# Patient Record
Sex: Female | Born: 1985 | Race: White | Hispanic: No | Marital: Married | Smoking: Never smoker
Health system: Southern US, Community
[De-identification: ages and names within clinical notes are randomized; demographics above are authoritative.]

## PROBLEM LIST (undated history)

## (undated) ENCOUNTER — Inpatient Hospital Stay (HOSPITAL_COMMUNITY): Payer: Self-pay

## (undated) DIAGNOSIS — Z789 Other specified health status: Secondary | ICD-10-CM

## (undated) HISTORY — PX: FINGER SURGERY: SHX640

---

## 2016-04-10 ENCOUNTER — Encounter (HOSPITAL_COMMUNITY): Payer: Self-pay | Admitting: *Deleted

## 2016-04-10 ENCOUNTER — Inpatient Hospital Stay (HOSPITAL_COMMUNITY)
Admission: AD | Admit: 2016-04-10 | Discharge: 2016-04-11 | Disposition: A | Discharge status: Home / Self Care | Payer: BC Managed Care – PPO | Source: Ambulatory Visit | Attending: Obstetrics and Gynecology | Admitting: Obstetrics and Gynecology

## 2016-04-10 ENCOUNTER — Inpatient Hospital Stay (HOSPITAL_COMMUNITY): Payer: BC Managed Care – PPO

## 2016-04-10 DIAGNOSIS — R1032 Left lower quadrant pain: Secondary | ICD-10-CM | POA: Insufficient documentation

## 2016-04-10 DIAGNOSIS — O26891 Other specified pregnancy related conditions, first trimester: Secondary | ICD-10-CM | POA: Diagnosis present

## 2016-04-10 DIAGNOSIS — Z3A01 Less than 8 weeks gestation of pregnancy: Secondary | ICD-10-CM | POA: Insufficient documentation

## 2016-04-10 DIAGNOSIS — O26899 Other specified pregnancy related conditions, unspecified trimester: Secondary | ICD-10-CM

## 2016-04-10 DIAGNOSIS — R109 Unspecified abdominal pain: Secondary | ICD-10-CM

## 2016-04-10 HISTORY — DX: Other specified health status: Z78.9

## 2016-04-10 NOTE — MAU Provider Note (Addendum)
History     Chief Complaint  Patient presents with  . Abdominal Pain   29 yo G2P1001 MF 6 6/[redacted] weeks gestation presents with c/o LLQ pain since 12 n. Pain is dull constant nonradiating, no assoc n/v/urinary sx or BM. (+) SPT in office with [facility].100K  OB History    Gravida Para Term Preterm AB TAB SAB Ectopic Multiple Living   2 1 1  0 0 0 0 0 0 1      Past Medical History  Diagnosis Date  . Medical history non-contributory    OB: NSVD Past Surgical History  Procedure Laterality Date  . Finger surgery      FH noncontributory  Social History  Substance Use Topics  . Smoking status: Never Smoker   . Smokeless tobacco: None  . Alcohol Use: No    Allergies: Not on File  No prescriptions prior to admission     Physical Exam   Blood pressure 111/60, pulse 77, temperature 98.2 F (36.8 C), temperature source Oral, resp. rate 18, height 5\' 6"  (1.676 m), weight 65.772 kg (145 lb), SpO2 98 %.  General appearance: alert, cooperative and no distress Abdomen: soft, non-tender; bowel sounds normal; no masses,  no organomegaly Pelvic: cervix normal in appearance, external genitalia normal and vagina.scant white discharge, cervix closed ant , uterus RV 8 weeks  size, right adnexa nontender, left mildly tender no discete mass  Extr: no edema  ED Course  LLQ pain (+) SPT P) TVS MDM   Jessica Kaiser A, MD 11:00 PM 04/10/2016     Addendum: sono done. No etiology for LLQ pain. SIUP (+) [facility] EDC 11/29/16 c/w LMP. Nl ovaries. Advised may take tylenol, use heat. cxl office appt 5/17. Schedule NOB appts

## 2016-04-10 NOTE — MAU Note (Signed)
Pt c/o dull, achy LLQ pain since today. Rates 6/10. Was told to come in by on call doctor. Denies vag bleeding or discharge. Did not take anything but did try to rest.

## 2016-04-10 NOTE — Discharge Instructions (Signed)
First Trimester of Pregnancy The first trimester of pregnancy is from week 1 until the end of week 12 (months 1 through 3). A week after a sperm fertilizes an egg, the egg will implant on the wall of the uterus. This embryo will begin to develop into a baby. Genes from you and your partner are forming the baby. The female genes determine whether the baby is a boy or a girl. At 6-8 weeks, the eyes and face are formed, and the heartbeat can be seen on ultrasound. At the end of 12 weeks, all the baby's organs are formed.  Now that you are pregnant, you will want to do everything you can to have a healthy baby. Two of the most important things are to get good prenatal care and to follow your health care provider's instructions. Prenatal care is all the medical care you receive before the baby's birth. This care will help prevent, find, and treat any problems during the pregnancy and childbirth. BODY CHANGES Your body goes through many changes during pregnancy. The changes vary from woman to woman.   You may gain or lose a couple of pounds at first.  You may feel sick to your stomach (nauseous) and throw up (vomit). If the vomiting is uncontrollable, call your health care provider.  You may tire easily.  You may develop headaches that can be relieved by medicines approved by your health care provider.  You may urinate more often. Painful urination may mean you have a bladder infection.  You may develop heartburn as a result of your pregnancy.  You may develop constipation because certain hormones are causing the muscles that push waste through your intestines to slow down.  You may develop hemorrhoids or swollen, bulging veins (varicose veins).  Your breasts may begin to grow larger and become tender. Your nipples may stick out more, and the tissue that surrounds them (areola) may become darker.  Your gums may bleed and may be sensitive to brushing and flossing.  Dark spots or blotches (chloasma,  mask of pregnancy) may develop on your face. This will likely fade after the baby is born.  Your menstrual periods will stop.  You may have a loss of appetite.  You may develop cravings for certain kinds of food.  You may have changes in your emotions from day to day, such as being excited to be pregnant or being concerned that something may go wrong with the pregnancy and baby.  You may have more vivid and strange dreams.  You may have changes in your hair. These can include thickening of your hair, rapid growth, and changes in texture. Some women also have hair loss during or after pregnancy, or hair that feels dry or thin. Your hair will most likely return to normal after your baby is born. WHAT TO EXPECT AT YOUR PRENATAL VISITS During a routine prenatal visit:  You will be weighed to make sure you and the baby are growing normally.  Your blood pressure will be taken.  Your abdomen will be measured to track your baby's growth.  The fetal heartbeat will be listened to starting around week 10 or 12 of your pregnancy.  Test results from any previous visits will be discussed. Your health care provider may ask you:  How you are feeling.  If you are feeling the baby move.  If you have had any abnormal symptoms, such as leaking fluid, bleeding, severe headaches, or abdominal cramping.  If you are using any tobacco products,   including cigarettes, chewing tobacco, and electronic cigarettes.  If you have any questions. Other tests that may be performed during your first trimester include:  Blood tests to find your blood type and to check for the presence of any previous infections. They will also be used to check for low iron levels (anemia) and Rh antibodies. Later in the pregnancy, blood tests for diabetes will be done along with other tests if problems develop.  Urine tests to check for infections, diabetes, or protein in the urine.  An ultrasound to confirm the proper growth  and development of the baby.  An amniocentesis to check for possible genetic problems.  Fetal screens for spina bifida and Down syndrome.  You may need other tests to make sure you and the baby are doing well.  HIV (human immunodeficiency virus) testing. Routine prenatal testing includes screening for HIV, unless you choose not to have this test. HOME CARE INSTRUCTIONS  Medicines  Follow your health care provider's instructions regarding medicine use. Specific medicines may be either safe or unsafe to take during pregnancy.  Take your prenatal vitamins as directed.  If you develop constipation, try taking a stool softener if your health care provider approves. Diet  Eat regular, well-balanced meals. Choose a variety of foods, such as meat or vegetable-based protein, fish, milk and low-fat dairy products, vegetables, fruits, and whole grain breads and cereals. Your health care provider will help you determine the amount of weight gain that is right for you.  Avoid raw meat and uncooked cheese. These carry germs that can cause birth defects in the baby.  Eating four or five small meals rather than three large meals a day may help relieve nausea and vomiting. If you start to feel nauseous, eating a few soda crackers can be helpful. Drinking liquids between meals instead of during meals also seems to help nausea and vomiting.  If you develop constipation, eat more high-fiber foods, such as fresh vegetables or fruit and whole grains. Drink enough fluids to keep your urine clear or pale yellow. Activity and Exercise  Exercise only as directed by your health care provider. Exercising will help you:  Control your weight.  Stay in shape.  Be prepared for labor and delivery.  Experiencing pain or cramping in the lower abdomen or low back is a good sign that you should stop exercising. Check with your health care provider before continuing normal exercises.  Try to avoid standing for long  periods of time. Move your legs often if you must stand in one place for a long time.  Avoid heavy lifting.  Wear low-heeled shoes, and practice good posture.  You may continue to have sex unless your health care provider directs you otherwise. Relief of Pain or Discomfort  Wear a good support bra for breast tenderness.   Take warm sitz baths to soothe any pain or discomfort caused by hemorrhoids. Use hemorrhoid cream if your health care provider approves.   Rest with your legs elevated if you have leg cramps or low back pain.  If you develop varicose veins in your legs, wear support hose. Elevate your feet for 15 minutes, 3-4 times a day. Limit salt in your diet. Prenatal Care  Schedule your prenatal visits by the twelfth week of pregnancy. They are usually scheduled monthly at first, then more often in the last 2 months before delivery.  Write down your questions. Take them to your prenatal visits.  Keep all your prenatal visits as directed by your   health care provider. Safety  Wear your seat belt at all times when driving.  Make a list of emergency phone numbers, including numbers for family, friends, the hospital, and police and fire departments. General Tips  Ask your health care provider for a referral to a local prenatal education class. Begin classes no later than at the beginning of month 6 of your pregnancy.  Ask for help if you have counseling or nutritional needs during pregnancy. Your health care provider can offer advice or refer you to specialists for help with various needs.  Do not use hot tubs, steam rooms, or saunas.  Do not douche or use tampons or scented sanitary pads.  Do not cross your legs for long periods of time.  Avoid cat litter boxes and soil used by cats. These carry germs that can cause birth defects in the baby and possibly loss of the fetus by miscarriage or stillbirth.  Avoid all smoking, herbs, alcohol, and medicines not prescribed by  your health care provider. Chemicals in these affect the formation and growth of the baby.  Do not use any tobacco products, including cigarettes, chewing tobacco, and electronic cigarettes. If you need help quitting, ask your health care provider. You may receive counseling support and other resources to help you quit.  Schedule a dentist appointment. At home, brush your teeth with a soft toothbrush and be gentle when you floss. SEEK MEDICAL CARE IF:   You have dizziness.  You have mild pelvic cramps, pelvic pressure, or nagging pain in the abdominal area.  You have persistent nausea, vomiting, or diarrhea.  You have a bad smelling vaginal discharge.  You have pain with urination.  You notice increased swelling in your face, hands, legs, or ankles. SEEK IMMEDIATE MEDICAL CARE IF:   You have a fever.  You are leaking fluid from your vagina.  You have spotting or bleeding from your vagina.  You have severe abdominal cramping or pain.  You have rapid weight gain or loss.  You vomit blood or material that looks like coffee grounds.  You are exposed to German measles and have never had them.  You are exposed to fifth disease or chickenpox.  You develop a severe headache.  You have shortness of breath.  You have any kind of trauma, such as from a fall or a car accident.   This information is not intended to replace advice given to you by your health care provider. Make sure you discuss any questions you have with your health care provider.   Document Released: 11/08/2001 Document Revised: 12/05/2014 Document Reviewed: 09/24/2013 Elsevier Interactive Patient Education 2016 [employer]  

## 2016-04-27 ENCOUNTER — Inpatient Hospital Stay (HOSPITAL_COMMUNITY)
Admission: AD | Admit: 2016-04-27 | Discharge: 2016-04-28 | Disposition: A | Discharge status: Home / Self Care | Payer: BC Managed Care – PPO | Source: Ambulatory Visit | Attending: Obstetrics and Gynecology | Admitting: Obstetrics and Gynecology

## 2016-04-27 ENCOUNTER — Encounter (HOSPITAL_COMMUNITY): Payer: Self-pay | Admitting: *Deleted

## 2016-04-27 DIAGNOSIS — Z3A09 9 weeks gestation of pregnancy: Secondary | ICD-10-CM | POA: Diagnosis not present

## 2016-04-27 DIAGNOSIS — O219 Vomiting of pregnancy, unspecified: Secondary | ICD-10-CM | POA: Insufficient documentation

## 2016-04-27 DIAGNOSIS — R1110 Vomiting, unspecified: Secondary | ICD-10-CM | POA: Diagnosis present

## 2016-04-27 LAB — URINE MICROSCOPIC-ADD ON

## 2016-04-27 LAB — URINALYSIS, ROUTINE W REFLEX MICROSCOPIC
BILIRUBIN URINE: NEGATIVE
Glucose, UA: NEGATIVE mg/dL
KETONES UR: 40 mg/dL — AB
LEUKOCYTES UA: NEGATIVE
NITRITE: NEGATIVE
PH: 6 (ref 5.0–8.0)
PROTEIN: 30 mg/dL — AB
Specific Gravity, Urine: >1.03 — ABNORMAL HIGH (ref 1.005–1.030)

## 2016-04-27 MED ORDER — PROMETHAZINE HCL 25 MG/ML IJ SOLN
25.0000 mg | Freq: Once | INTRAVENOUS | Status: AC
  Administered 2016-04-27: 25 mg via INTRAVENOUS
  Filled 2016-04-27: qty 1

## 2016-04-27 MED ORDER — LACTATED RINGERS IV BOLUS (SEPSIS)
1000.0000 mL | Freq: Once | INTRAVENOUS | Status: AC
  Administered 2016-04-27: 1000 mL via INTRAVENOUS

## 2016-04-27 NOTE — MAU Provider Note (Signed)
History     CSN: [id number]  Arrival date and time: 04/27/16 2124   First Provider Initiated Contact with Patient 04/27/16 2218      Chief Complaint  Patient presents with  . Emesis   HPI Comments: Jessica Kaiser is a 30 y.o G2P1001 at 966w2d who presents otday with nausea and vomiting. She states that it started today and she has vomited about 8x. She called her Dr. Isidore Moosffice and a prescription for phenergan suppositories was called. She states that they did not help much.   Emesis  This is a new problem. The current episode started today. The problem occurs 5 to 10 times per day. The problem has been gradually worsening. The emesis has an appearance of stomach contents. There has been no fever. Pertinent negatives include no chills, diarrhea or fever. Risk factors: pregnancy  Treatments tried: phenergan suppository  The treatment provided mild relief.   Past Medical History  Diagnosis Date  . Medical history non-contributory     Past Surgical History  Procedure Laterality Date  . Finger surgery      History reviewed. No pertinent family history.  Social History  Substance Use Topics  . Smoking status: Never Smoker   . Smokeless tobacco: Never Used  . Alcohol Use: No    Allergies: No Known Allergies  Prescriptions prior to admission  Medication Sig Dispense Refill Last Dose  . Prenatal Vit-Fe Fumarate-FA (PRENATAL MULTIVITAMIN) TABS tablet Take 1 tablet by mouth daily at 12 noon.   04/26/2016 at Unknown time  . PROMETHEGAN 25 MG suppository Place 25 mg rectally every 4 (four) hours as needed for nausea or vomiting.    04/27/2016 at Unknown time    Review of Systems  Constitutional: Negative for fever and chills.  Gastrointestinal: Positive for nausea and vomiting. Negative for diarrhea and constipation.  Genitourinary: Negative for dysuria, urgency and frequency.   Physical Exam   Blood pressure 99/63, pulse 76, temperature 98.3 F (36.8 C), temperature source Oral,  resp. rate 18, height 5\' 6"  (1.676 m), weight 64.864 kg (143 lb).  Physical Exam  Nursing note and vitals reviewed. Constitutional: She is oriented to person, place, and time. She appears well-developed and well-nourished. No distress.  HENT:  Head: Normocephalic.  Cardiovascular: Normal rate.   Respiratory: Effort normal.  GI: Soft. There is no tenderness. There is no rebound.  Neurological: She is alert and oriented to person, place, and time.  Skin: Skin is warm and dry.  Psychiatric: She has a normal mood and affect.    Results for orders placed or performed during the hospital encounter of 04/27/16 (from the past 24 hour(s))  Urinalysis, Routine w reflex microscopic (not at [facility])     Status: Abnormal   Collection Time: 04/27/16  9:28 PM  Result Value Ref Range   Color, Urine YELLOW YELLOW   APPearance CLEAR CLEAR   Specific Gravity, Urine >1.030 (H) 1.005 - 1.030   pH 6.0 5.0 - 8.0   Glucose, UA NEGATIVE NEGATIVE mg/dL   Hgb urine dipstick TRACE (A) NEGATIVE   Bilirubin Urine NEGATIVE NEGATIVE   Ketones, ur 40 (A) NEGATIVE mg/dL   Protein, ur 30 (A) NEGATIVE mg/dL   Nitrite NEGATIVE NEGATIVE   Leukocytes, UA NEGATIVE NEGATIVE  Urine microscopic-add on     Status: Abnormal   Collection Time: 04/27/16  9:28 PM  Result Value Ref Range   Squamous Epithelial / LPF 0-5 (A) NONE SEEN   WBC, UA 0-5 0 - 5 WBC/hpf  RBC / HPF 0-5 0 - 5 RBC/hpf   Bacteria, UA FEW (A) NONE SEEN   Urine-Other MUCOUS PRESENT     MAU Course  Procedures  MDM 0045: Patient has had D5LR with phenergan and second LR bolus. She reports feeling better and is tolerating PO. 0048: D/W Dr. Billy Coast, ok for DC home with RX for phenergan tablets.    Assessment and Plan   1. Nausea and vomiting during pregnancy prior to [redacted] weeks gestation   2. [redacted] weeks gestation of pregnancy    DC home Comfort measures reviewed  1st Trimester precautions  RX: phenergan #30  Return to MAU as needed FU with OB as  planned  Follow-up Information    Follow up with Robley Fries, MD.   Specialty:  Obstetrics and Gynecology   Why:  As scheduled   Contact information:   [address] [place] [address] [phone]       Tawnya Crook 04/27/2016, 10:20 PM

## 2016-04-27 NOTE — MAU Note (Signed)
Pt presents complaining of nausea and vomiting x24hrs. Hasn't kept any fluid or foods down. Denies pain. Denies vaginal bleeding or discharge.

## 2016-04-28 DIAGNOSIS — O219 Vomiting of pregnancy, unspecified: Secondary | ICD-10-CM | POA: Diagnosis not present

## 2016-04-28 DIAGNOSIS — Z3A09 9 weeks gestation of pregnancy: Secondary | ICD-10-CM

## 2016-04-28 MED ORDER — PROMETHAZINE HCL 25 MG PO TABS: 12.5000 mg | ORAL_TABLET | Freq: Four times a day (QID) | ORAL | Status: AC | PRN

## 2016-04-28 NOTE — Discharge Instructions (Signed)

## 2016-11-27 ENCOUNTER — Other Ambulatory Visit
Admission: RE | Admit: 2016-11-27 | Discharge: 2016-11-27 | Disposition: A | Discharge status: Home / Self Care | Payer: BC Managed Care – PPO | Source: Ambulatory Visit | Attending: Certified Nurse Midwife | Admitting: Certified Nurse Midwife

## 2016-11-27 DIAGNOSIS — Z3A39 39 weeks gestation of pregnancy: Secondary | ICD-10-CM | POA: Insufficient documentation

## 2016-11-27 LAB — CBC
HCT: 43.3 % (ref 35.0–47.0)
HEMOGLOBIN: 14.7 g/dL (ref 12.0–16.0)
MCH: 34.8 pg — ABNORMAL HIGH (ref 26.0–34.0)
MCHC: 34 g/dL (ref 32.0–36.0)
MCV: 102.5 fL — AB (ref 80.0–100.0)
PLATELETS: 220 10*3/uL (ref 150–440)
RBC: 4.22 MIL/uL (ref 3.80–5.20)
RDW: 16.6 % — ABNORMAL HIGH (ref 11.5–14.5)
WBC: 8 10*3/uL (ref 3.6–11.0)

## 2016-11-27 LAB — ANTIBODY SCREEN: Antibody Screen: NEGATIVE

## 2016-11-27 LAB — ABO/RH: ABO/RH(D): A POS

## 2017-02-13 ENCOUNTER — Encounter (HOSPITAL_COMMUNITY): Payer: Self-pay

## 2020-03-17 ENCOUNTER — Other Ambulatory Visit: Payer: Self-pay

## 2020-03-17 DIAGNOSIS — N6310 Unspecified lump in the right breast, unspecified quadrant: Secondary | ICD-10-CM

## 2020-03-20 ENCOUNTER — Other Ambulatory Visit: Payer: Self-pay

## 2020-03-20 ENCOUNTER — Ambulatory Visit
Admission: RE | Admit: 2020-03-20 | Discharge: 2020-03-20 | Disposition: A | Discharge status: Home / Self Care | Payer: BC Managed Care – PPO | Source: Ambulatory Visit

## 2020-03-20 ENCOUNTER — Ambulatory Visit
Admission: RE | Admit: 2020-03-20 | Discharge: 2020-03-20 | Disposition: A | Discharge status: Home / Self Care | Payer: Commercial Managed Care - PPO | Source: Ambulatory Visit

## 2020-03-20 DIAGNOSIS — N6310 Unspecified lump in the right breast, unspecified quadrant: Secondary | ICD-10-CM

## 2020-09-23 ENCOUNTER — Other Ambulatory Visit: Payer: Self-pay

## 2020-09-23 ENCOUNTER — Ambulatory Visit
Admission: RE | Admit: 2020-09-23 | Discharge: 2020-09-23 | Disposition: A | Discharge status: Home / Self Care | Payer: Commercial Managed Care - PPO | Source: Ambulatory Visit

## 2020-09-23 DIAGNOSIS — N6310 Unspecified lump in the right breast, unspecified quadrant: Secondary | ICD-10-CM

## 2021-01-07 IMAGING — MG DIGITAL DIAGNOSTIC BILAT W/ TOMO W/ CAD
8 of 13 series · 8 of 33 positions shown · non-contrast
Comparison: None.

CLINICAL DATA: Patient presents for palpable abnormality within the
outer right breast.

EXAM:
DIGITAL DIAGNOSTIC BILATERAL MAMMOGRAM WITH CAD AND TOMO
ULTRASOUND RIGHT BREAST

[L CC]
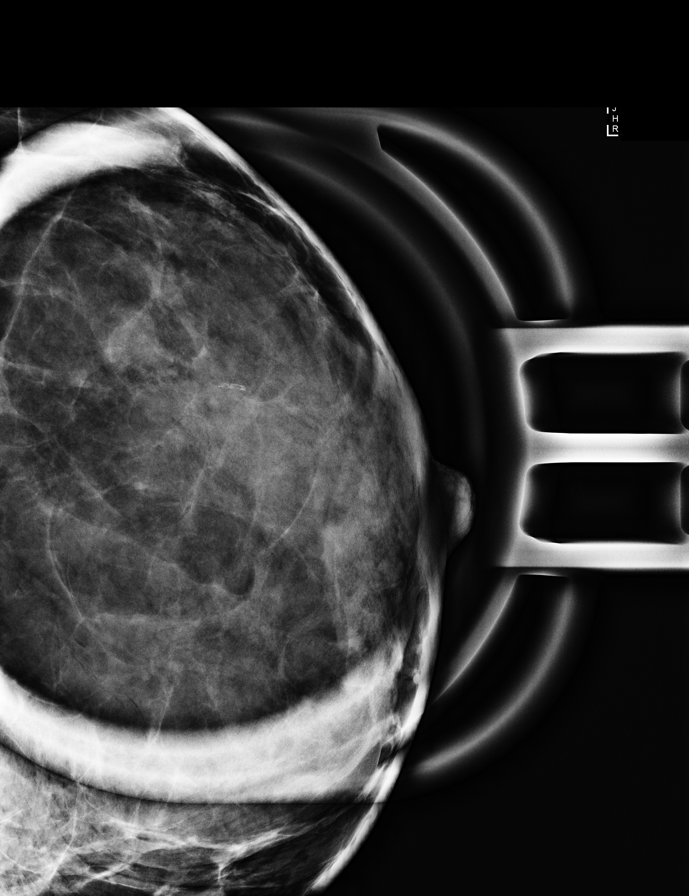

[L ML (1 of 2)]
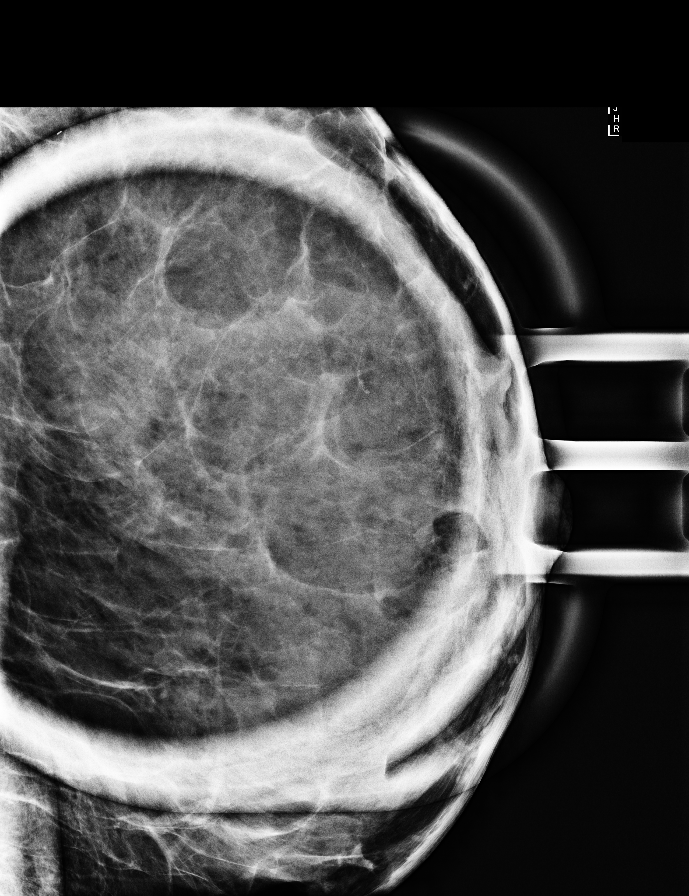

[L ML (2 of 2)]
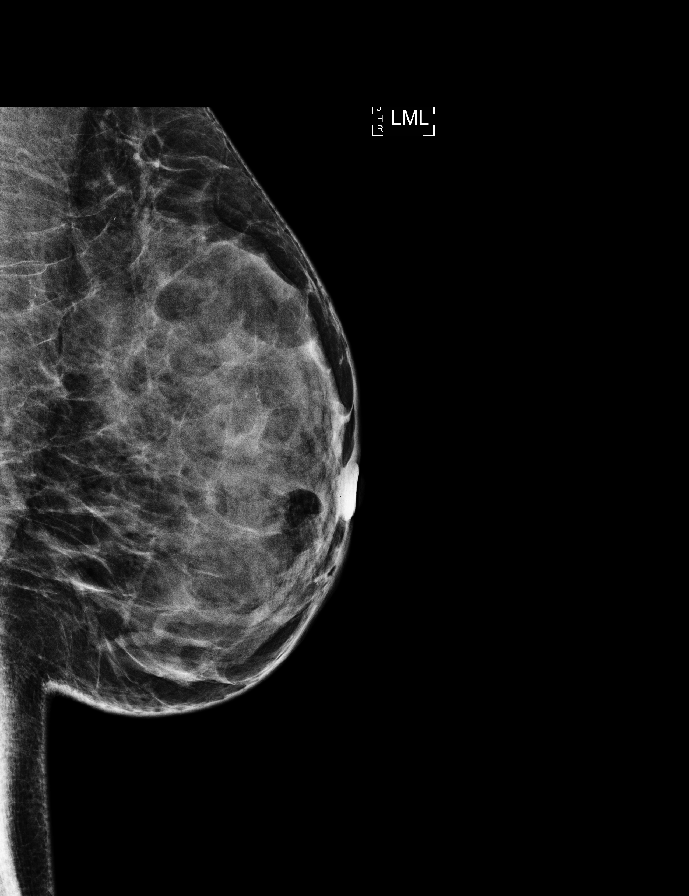

[R MLO synth-2D]
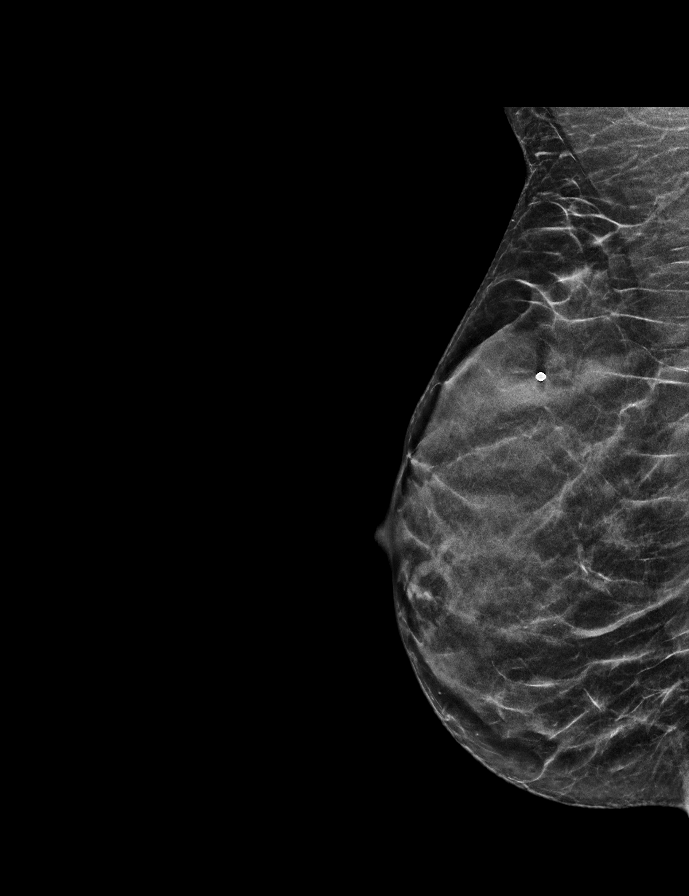

[R TAN synth-2D]
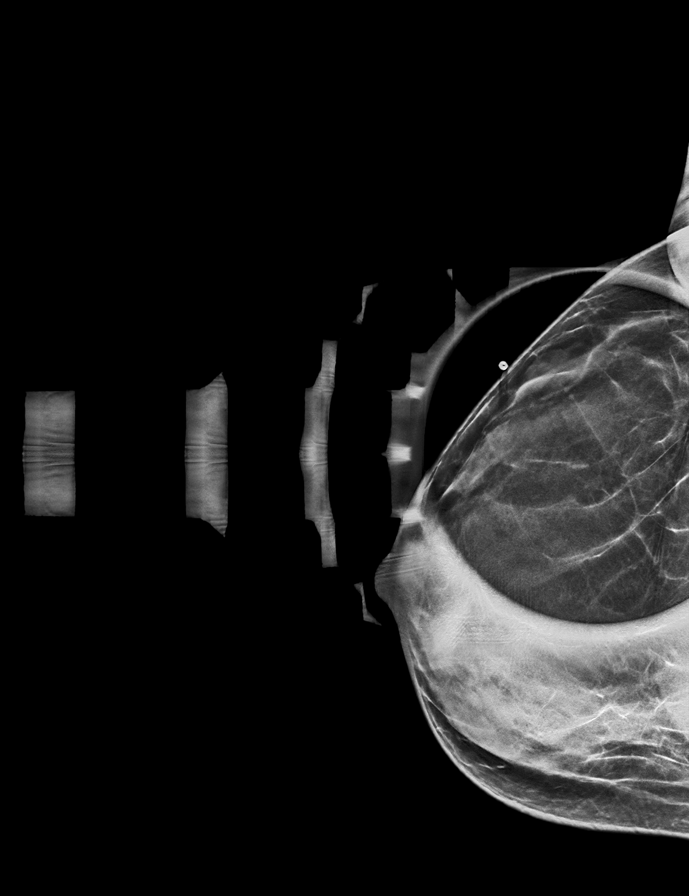

[L MLO synth-2D]
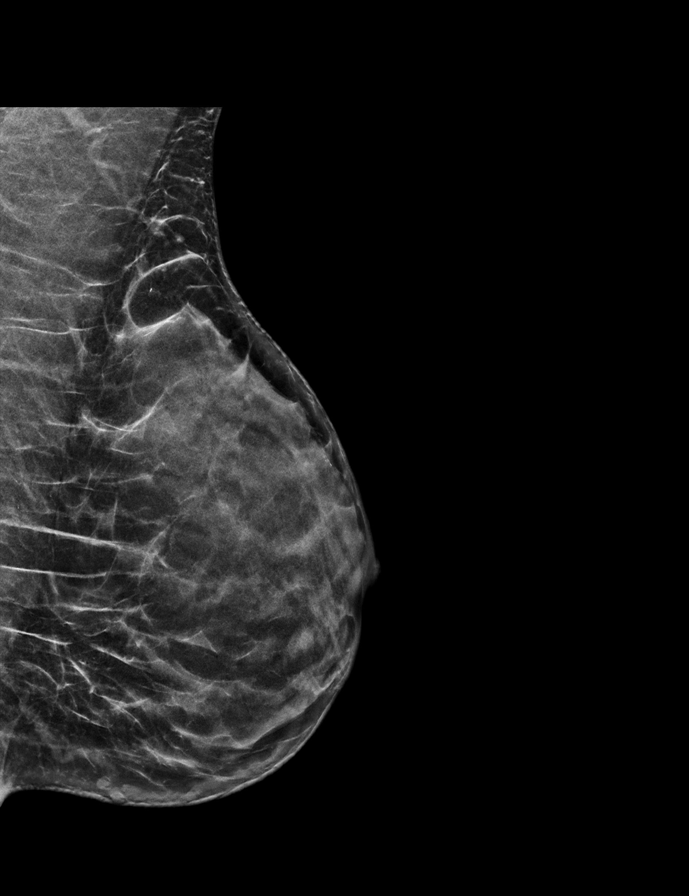

[L CC synth-2D]
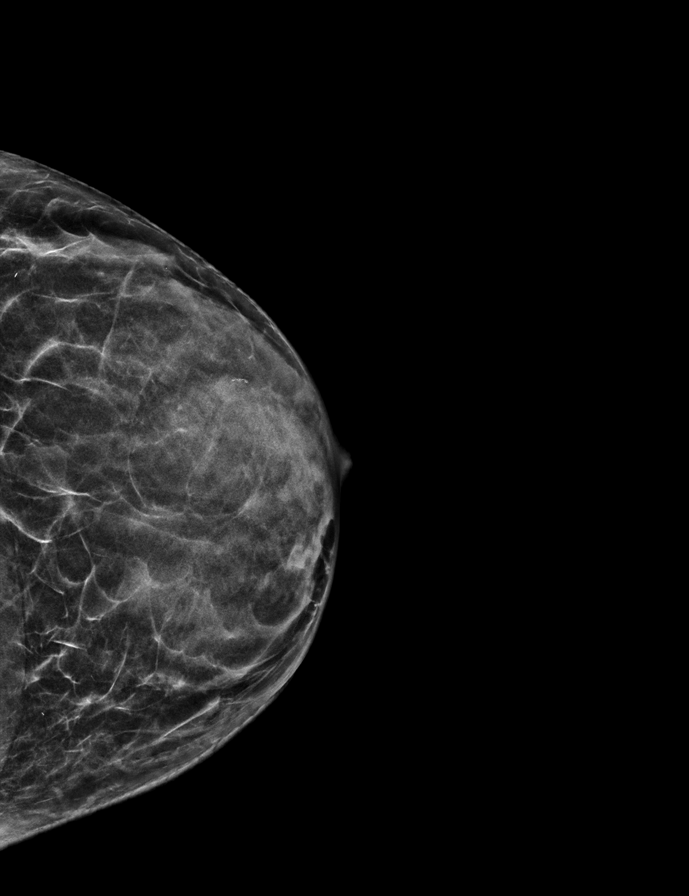

[R CC synth-2D]
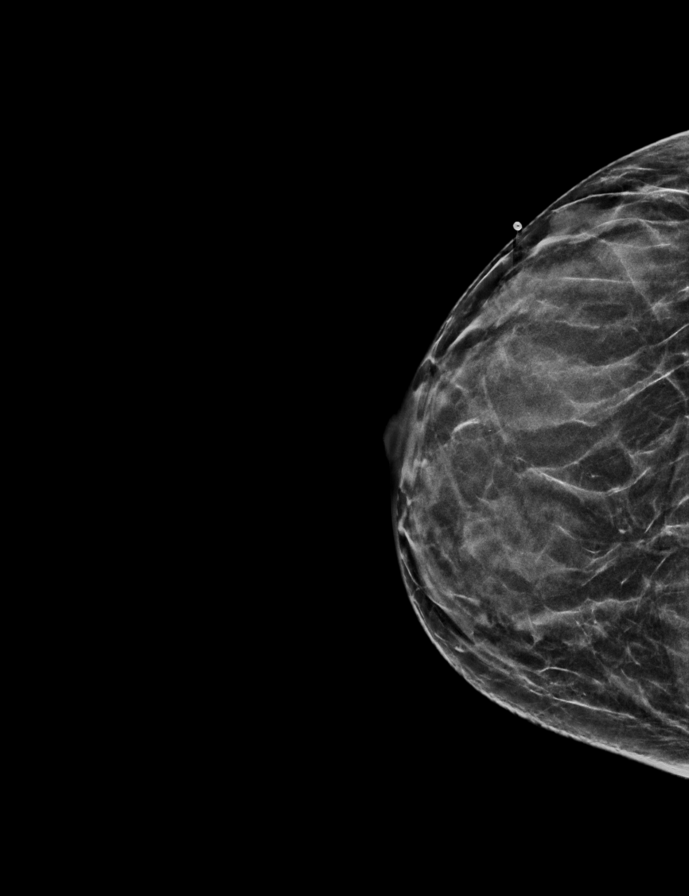

[8 of 33 positions shown; findings below may reference images not displayed]

ACR Breast Density Category c: The breast tissue is heterogeneously
dense, which may obscure small masses.
FINDINGS: Underlying the palpable marker within the outer aspect of the right
breast is a partially obscured mass.

Within the upper-outer left breast there is a small group of
vascular calcifications, further evaluated with magnification views.

No additional concerning masses, calcifications or distortion within
the right or left breast.

Mammographic images were processed with CAD.

On physical exam, there is a small palpable mass within the outer
right breast.

Targeted ultrasound is performed, showing a 1.2 x 0.4 x 0.8 cm oval
circumscribed hypoechoic mass right breast 9 o'clock position 4 cm
from the nipple.
IMPRESSION: Probably benign mass within the right breast suggestive of a
fibroadenoma.

RECOMMENDATION:
Right breast ultrasound in 6 months to reassess probably benign
right breast mass 9 o'clock position.

I have discussed the findings and recommendations with the patient.
If applicable, a reminder letter will be sent to the patient
regarding the next appointment.

BI-RADS CATEGORY  3: Probably benign.

## 2021-03-29 ENCOUNTER — Inpatient Hospital Stay: Admission: RE | Admit: 2021-03-29 | Payer: Commercial Managed Care - PPO | Source: Ambulatory Visit

## 2021-05-26 ENCOUNTER — Other Ambulatory Visit: Payer: Commercial Managed Care - PPO

## 2021-07-13 IMAGING — US A
1 series · 5 of 5 positions shown · non-contrast
Comparison: 03/20/2020

CLINICAL DATA: Follow-up right breast probable benign fibroadenoma.
The patient reports no change in size on palpation. No family
history of breast cancer.

EXAM:
ULTRASOUND OF THE RIGHT BREAST

[Series 1: a · 0.05mm/px · 5 of 5 slices shown]
[im 1/5]
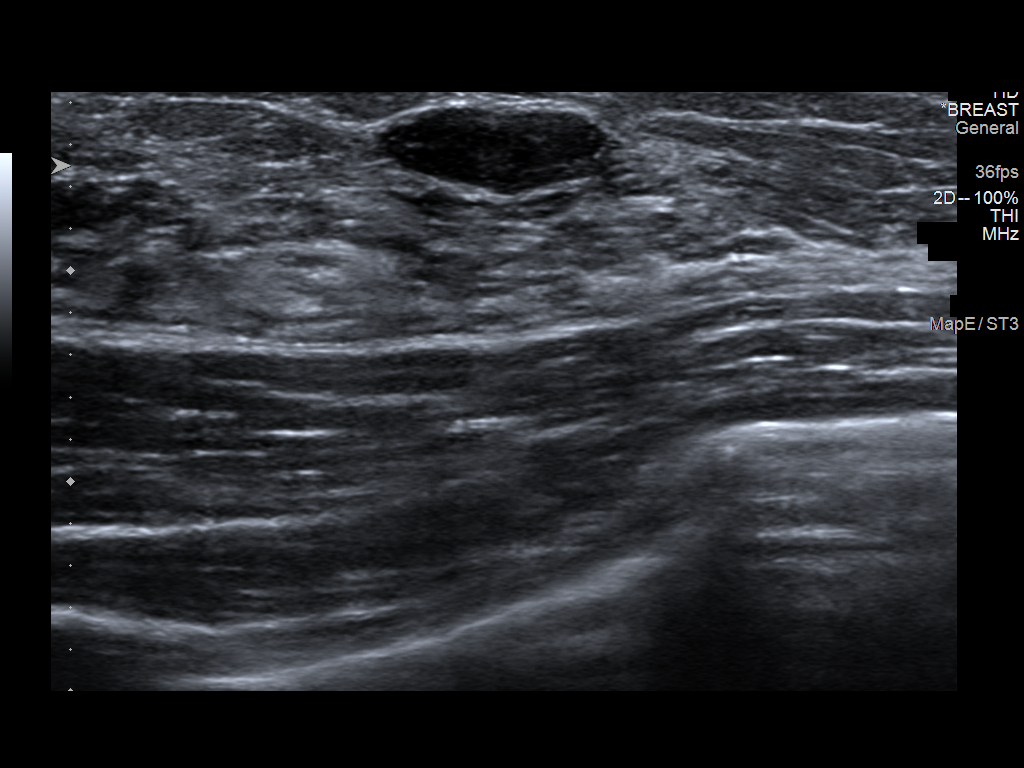
[im 2/5]
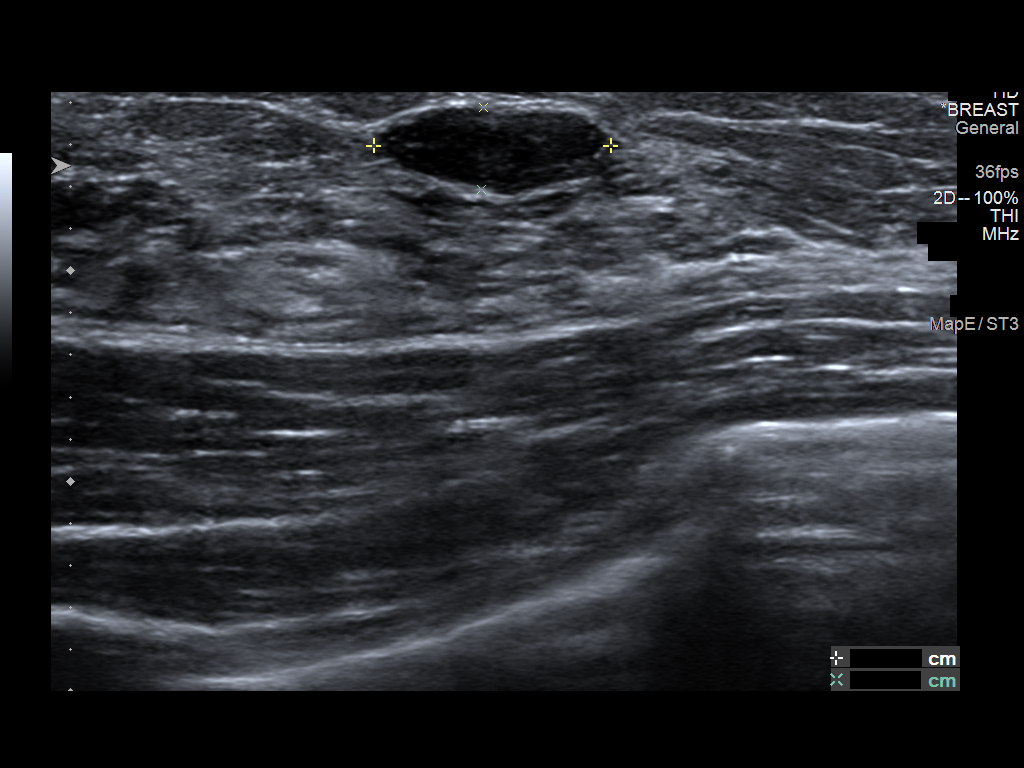
[im 3/5]
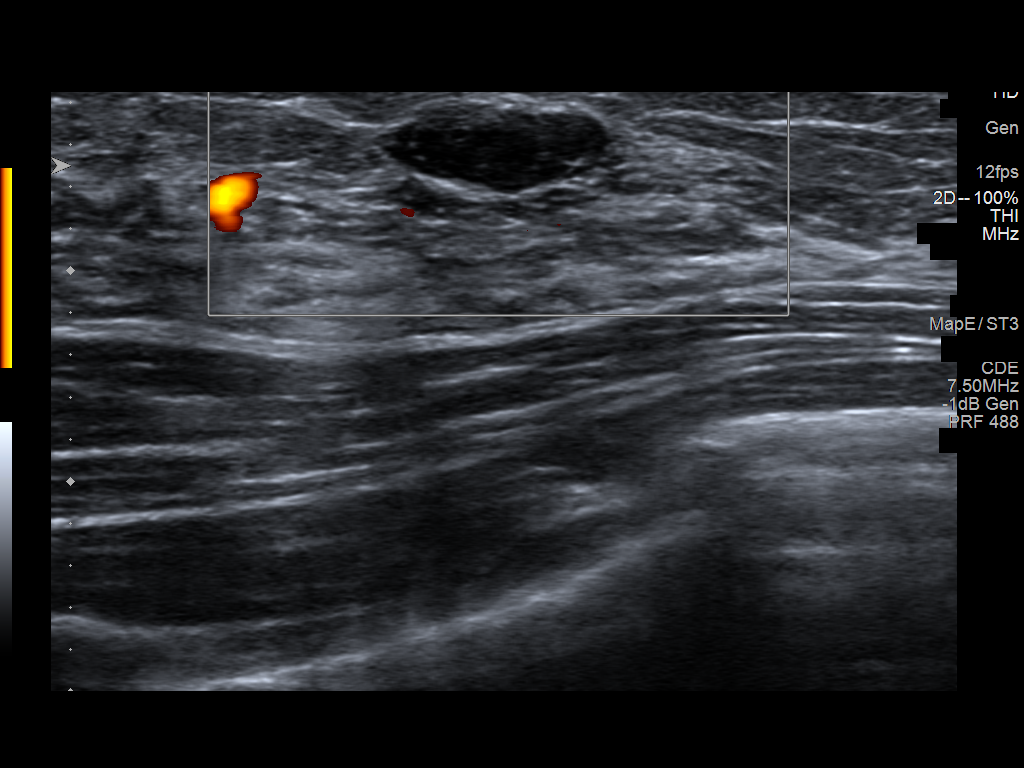
[im 4/5]
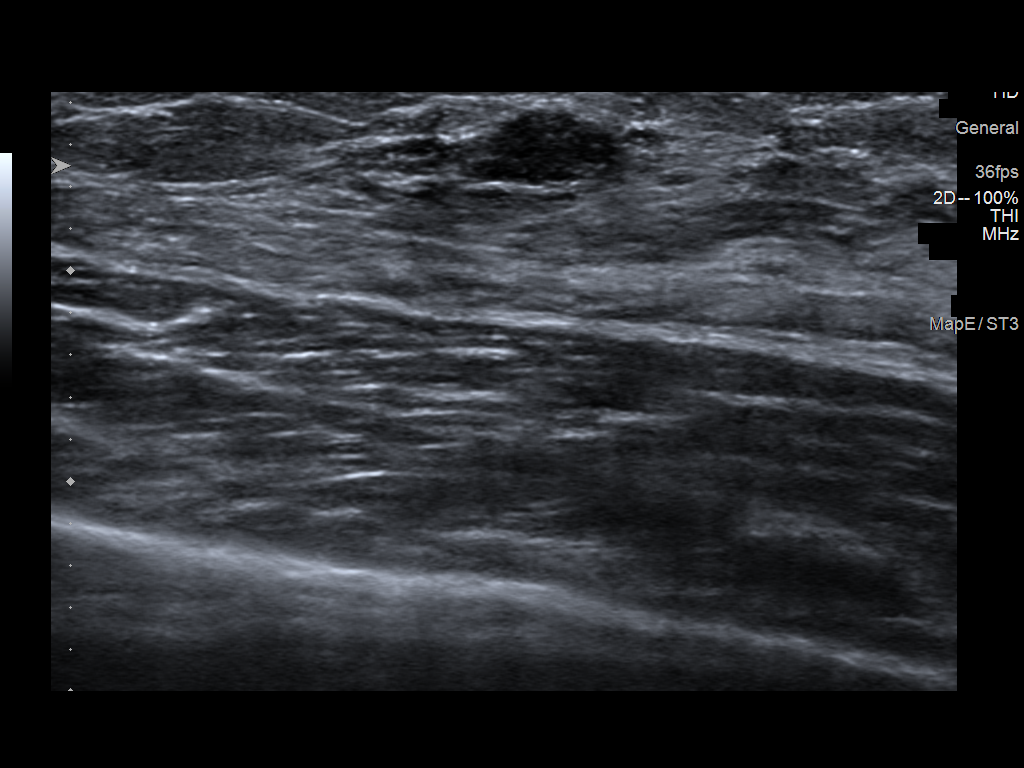
[im 5/5]
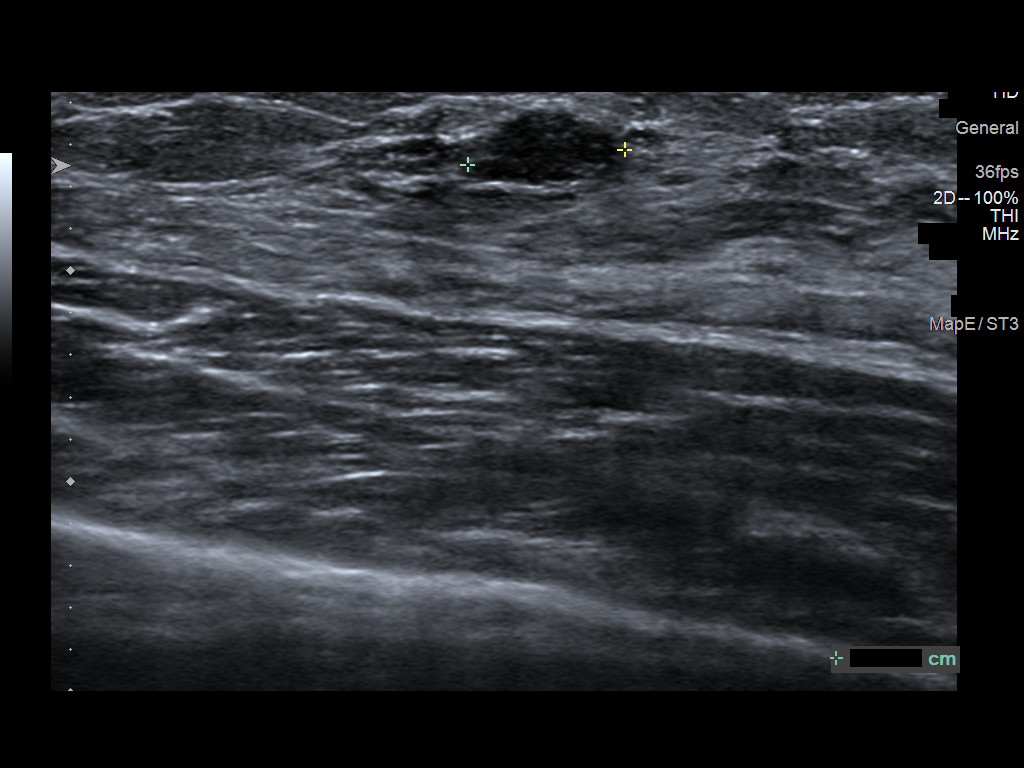

[5 of 5 positions shown; findings below may reference images not displayed]

FINDINGS: Targeted ultrasound is performed, showing an oval, horizontally
oriented, circumscribed, hypoechoic mass in the 9 o'clock position
of the right breast, 4 cm from the nipple. This measures 1.1 x 0.8 x
0.4 cm in maximum dimensions. On 03/20/2020, this measured 1.2 x
x 0 4 cm.
IMPRESSION: Stable right breast probable benign fibroadenoma.

RECOMMENDATION:
Right breast ultrasound in 6 months.

I have discussed the findings and recommendations with the patient.
If applicable, a reminder letter will be sent to the patient
regarding the next appointment.

BI-RADS CATEGORY  3: Probably benign.

## 2021-09-20 ENCOUNTER — Other Ambulatory Visit (HOSPITAL_COMMUNITY): Payer: Self-pay

## 2021-09-20 DIAGNOSIS — N6310 Unspecified lump in the right breast, unspecified quadrant: Secondary | ICD-10-CM

## 2022-05-25 ENCOUNTER — Other Ambulatory Visit: Payer: Self-pay

## 2022-06-02 ENCOUNTER — Telehealth: Payer: Self-pay

## 2022-06-02 NOTE — Telephone Encounter (Signed)
We received a referral from Gearldine Bienenstock at [facility] to have Genetic Counseling for her abnormal Anora results.   I called the patient and left a voicemail for her to call back and schedule the appointment.

## 2022-06-11 ENCOUNTER — Inpatient Hospital Stay (HOSPITAL_COMMUNITY)
Admission: EM | Admit: 2022-06-11 | Discharge: 2022-06-11 | Disposition: A | Discharge status: Home / Self Care | Payer: Commercial Managed Care - PPO | Attending: Obstetrics | Admitting: Obstetrics

## 2022-06-11 ENCOUNTER — Inpatient Hospital Stay (HOSPITAL_COMMUNITY): Payer: Commercial Managed Care - PPO

## 2022-06-11 DIAGNOSIS — O26891 Other specified pregnancy related conditions, first trimester: Secondary | ICD-10-CM | POA: Insufficient documentation

## 2022-06-11 DIAGNOSIS — R102 Pelvic and perineal pain: Secondary | ICD-10-CM | POA: Diagnosis present

## 2022-06-11 DIAGNOSIS — Z3201 Encounter for pregnancy test, result positive: Secondary | ICD-10-CM | POA: Diagnosis not present

## 2022-06-11 LAB — COMPREHENSIVE METABOLIC PANEL
ALT: 14 U/L (ref 0–44)
AST: 22 U/L (ref 15–41)
Albumin: 3.8 g/dL (ref 3.5–5.0)
Alkaline Phosphatase: 42 U/L (ref 38–126)
Anion gap: 13 (ref 5–15)
BUN: 14 mg/dL (ref 6–20)
CO2: 22 mmol/L (ref 22–32)
Calcium: 9 mg/dL (ref 8.9–10.3)
Chloride: 104 mmol/L (ref 98–111)
Creatinine, Ser: 0.97 mg/dL (ref 0.44–1.00)
GFR, Estimated: >60 mL/min (ref >60)
Glucose, Bld: 128 mg/dL — ABNORMAL HIGH (ref 70–99)
Potassium: 3.7 mmol/L (ref 3.5–5.1)
Sodium: 139 mmol/L (ref 135–145)
Total Bilirubin: 0.5 mg/dL (ref 0.3–1.2)
Total Protein: 6.8 g/dL (ref 6.5–8.1)

## 2022-06-11 LAB — CBC WITH DIFFERENTIAL/PLATELET
Abs Immature Granulocytes: 0.09 10*3/uL — ABNORMAL HIGH (ref 0.00–0.07)
Basophils Absolute: 0 10*3/uL (ref 0.0–0.1)
Basophils Relative: 1 %
Eosinophils Absolute: 0.1 10*3/uL (ref 0.0–0.5)
Eosinophils Relative: 2 %
HCT: 37.4 % (ref 36.0–46.0)
Hemoglobin: 13 g/dL (ref 12.0–15.0)
Immature Granulocytes: 1 %
Lymphocytes Relative: 32 %
Lymphs Abs: 2.1 10*3/uL (ref 0.7–4.0)
MCH: 32.1 pg (ref 26.0–34.0)
MCHC: 34.8 g/dL (ref 30.0–36.0)
MCV: 92.3 fL (ref 80.0–100.0)
Monocytes Absolute: 0.4 10*3/uL (ref 0.1–1.0)
Monocytes Relative: 6 %
Neutro Abs: 3.8 10*3/uL (ref 1.7–7.7)
Neutrophils Relative %: 58 %
Platelets: 175 10*3/uL (ref 150–400)
RBC: 4.05 MIL/uL (ref 3.87–5.11)
RDW: 11.8 % (ref 11.5–15.5)
WBC: 6.6 10*3/uL (ref 4.0–10.5)
nRBC: 0 % (ref 0.0–0.2)

## 2022-06-11 LAB — URINALYSIS, ROUTINE W REFLEX MICROSCOPIC
Bilirubin Urine: NEGATIVE
Glucose, UA: NEGATIVE mg/dL
Hgb urine dipstick: NEGATIVE
Ketones, ur: NEGATIVE mg/dL
Leukocytes,Ua: NEGATIVE
Nitrite: NEGATIVE
Protein, ur: 30 mg/dL — AB
Specific Gravity, Urine: 1.028 (ref 1.005–1.030)
pH: 7 (ref 5.0–8.0)

## 2022-06-11 LAB — LIPASE, BLOOD: Lipase: 30 U/L (ref 11–51)

## 2022-06-11 LAB — HCG, QUANTITATIVE, PREGNANCY: hCG, Beta Chain, Quant, S: 67 m[IU]/mL — ABNORMAL HIGH (ref <5)

## 2022-06-11 LAB — PREGNANCY, URINE: Preg Test, Ur: POSITIVE — AB

## 2022-06-11 NOTE — MAU Note (Addendum)
Jessica Kaiser is a 36 y.o. at Unknown here in MAU reporting: here from main ED. C/o sharp lower abdominal pain, nausea, dizziness, "tingling" in her hands. Reports D&C on June 23rd. This past Tuesday had check up and "everything was fine". Took oxycodone about 40 minutes prior to arriving to the hospital for the pain.Marland Kitchen "I don't really feel anything right now". States she did have intercourse today and during that time is when the pain started. Denies VB.   Onset of complaint: 1630 Pain score: 0  Vitals:   06/11/22 1713 06/11/22 1916  BP: 108/75 112/69  Pulse: 91 77  Resp: 16 17  Temp: 99.3 F (37.4 C) 98.2 F (36.8 C)  SpO2: 97% 96%      FHT: n/a  Lab orders placed from triage: none.

## 2022-06-11 NOTE — ED Triage Notes (Addendum)
Sudden onset of RLQ pain, nausea, diaphoresis, dizziness. She had D&C procedure 3 weeks ago due to miscarriage a few weeks ago. Denies abnormal discharge. Tues had f/u appt with her OB and everything was normal. Still has appendix.  Took oxycodone left over from procedure and pain is better but not gone. Denies fevers.

## 2022-06-11 NOTE — MAU Provider Note (Signed)
History     CSN: [id number]  Arrival date and time: 06/11/22 1705   None     Chief Complaint  Patient presents with   Abdominal Pain   Jessica Kaiser is a 36 y.o. G2P1001 at Unknown who presents to MAU for lower abdominal pain. Patient presented to the ED initially for pain, but was transferred to MAU due to recent D&C. In speaking with Arlan Organ, CNM, confirmed that patient has a D&C about 3 weeks ago for missed AB at around 10 weeks and had follow-up visit with office last week which was normal. Patient states by the time she arrived to MAU she was no longer experiencing pain and was feeling back to her normal self minus feeling slightly fatigued. Patient states she took an oxycodone on her way to the hospital. Patient denies vaginal bleeding. Patient states she had intercourse for the first time after the surgery tonight and the pain started during intercourse. Patient's husband present for entire visit.    OB History     Gravida  2   Para  1   Term  1   Preterm  0   AB  0   Living  1      SAB  0   IAB  0   Ectopic  0   Multiple  0   Live Births              Past Medical History:  Diagnosis Date   Medical history non-contributory     Past Surgical History:  Procedure Laterality Date   FINGER SURGERY      No family history on file.  Social History   Tobacco Use   Smoking status: Never   Smokeless tobacco: Never  Substance Use Topics   Alcohol use: No   Drug use: No    Allergies: No Known Allergies  Medications Prior to Admission  Medication Sig Dispense Refill Last Dose   Prenatal Vit-Fe Fumarate-FA (PRENATAL MULTIVITAMIN) TABS tablet Take 1 tablet by mouth daily at 12 noon.      promethazine (PHENERGAN) 25 MG tablet Take 0.5-1 tablets (12.5-25 mg total) by mouth every 6 (six) hours as needed. 30 tablet 0     Review of Systems  Constitutional:  Negative for chills, diaphoresis, fatigue and fever.  Eyes:  Negative for visual  disturbance.  Respiratory:  Negative for shortness of breath.   Cardiovascular:  Negative for chest pain.  Gastrointestinal:  Positive for abdominal pain. Negative for constipation, diarrhea, nausea and vomiting.  Genitourinary:  Negative for dysuria, flank pain, frequency, pelvic pain, urgency, vaginal bleeding and vaginal discharge.  Neurological:  Negative for dizziness, weakness, light-headedness and headaches.   Physical Exam   Blood pressure 112/69, pulse 77, temperature 98.2 F (36.8 C), temperature source Oral, resp. rate 17, height 5\' 6"  (1.676 m), weight 79.2 kg, last menstrual period 05/27/2022, SpO2 96 %, unknown if currently breastfeeding.  Patient Vitals for the past 24 hrs:  BP Temp Temp src Pulse Resp SpO2 Height Weight  06/11/22 1916 112/69 98.2 F (36.8 C) Oral 77 17 96 % 5\' 6"  (1.676 m) 79.2 kg  06/11/22 1713 108/75 99.3 F (37.4 C) Oral 91 16 97 % -- --   Physical Exam Vitals and nursing note reviewed.  Constitutional:      General: She is not in acute distress.    Appearance: Normal appearance. She is not ill-appearing, toxic-appearing or diaphoretic.  HENT:     Head: Normocephalic and atraumatic.  Pulmonary:     Effort: Pulmonary effort is normal.  Neurological:     Mental Status: She is alert and oriented to person, place, and time.  Psychiatric:        Mood and Affect: Mood normal.        Behavior: Behavior normal.        Thought Content: Thought content normal.        Judgment: Judgment normal.    MAU Course  Procedures  MDM -labs performed in ED: CMP, CBC, Lipase WNL -UPT: positive -hCG: 67 -UA: 30PRO/rare bacteria -[place]: Ill-defined focal area of slight thickening in the lower endometrium. Correlation with clinical exam and serial hCG levels recommended to exclude retained product of conception. -consulted with Dr. Despina Hidden who reviews [place] images, nothing to do at this time for patient, patient does not need Cytotec and does not need to f/u with OB  for elevated hCG -pt discharged to home in stable condition  Assessment and Plan   1. Pelvic pain    Allergies as of 06/11/2022   No Known Allergies      Medication List     TAKE these medications    prenatal multivitamin Tabs tablet Take 1 tablet by mouth daily at 12 noon.   promethazine 25 MG tablet Commonly known as: PHENERGAN Take 0.5-1 tablets (12.5-25 mg total) by mouth every 6 (six) hours as needed.       -pt advised is cleared from pregnancy perspective and if pain returns should present to the ED -pt advised she is welcome to be seen in the ED at this time if she would like further evaluation of the origin of the pain at this time -return MAU precautions given -pt discharged to home in stable condition  Joni Reining E Irina Okelly 06/11/2022, 8:49 PM

## 2022-07-07 ENCOUNTER — Ambulatory Visit: Payer: Commercial Managed Care - PPO

## 2022-07-25 ENCOUNTER — Ambulatory Visit: Payer: Commercial Managed Care - PPO

## 2022-12-14 ENCOUNTER — Inpatient Hospital Stay (HOSPITAL_COMMUNITY)
Admission: AD | Admit: 2022-12-14 | Discharge: 2022-12-14 | Disposition: A | Discharge status: Home / Self Care | Payer: Commercial Managed Care - PPO | Attending: Obstetrics | Admitting: Obstetrics

## 2022-12-14 ENCOUNTER — Inpatient Hospital Stay (HOSPITAL_COMMUNITY): Payer: Commercial Managed Care - PPO

## 2022-12-14 ENCOUNTER — Encounter (HOSPITAL_COMMUNITY): Payer: Self-pay | Admitting: *Deleted

## 2022-12-14 DIAGNOSIS — O208 Other hemorrhage in early pregnancy: Secondary | ICD-10-CM | POA: Diagnosis present

## 2022-12-14 DIAGNOSIS — R109 Unspecified abdominal pain: Secondary | ICD-10-CM | POA: Diagnosis not present

## 2022-12-14 DIAGNOSIS — O209 Hemorrhage in early pregnancy, unspecified: Secondary | ICD-10-CM

## 2022-12-14 DIAGNOSIS — Z3A01 Less than 8 weeks gestation of pregnancy: Secondary | ICD-10-CM | POA: Insufficient documentation

## 2022-12-14 DIAGNOSIS — O26891 Other specified pregnancy related conditions, first trimester: Secondary | ICD-10-CM

## 2022-12-14 DIAGNOSIS — O26899 Other specified pregnancy related conditions, unspecified trimester: Secondary | ICD-10-CM

## 2022-12-14 LAB — URINALYSIS, ROUTINE W REFLEX MICROSCOPIC
Bilirubin Urine: NEGATIVE
Glucose, UA: NEGATIVE mg/dL
Ketones, ur: NEGATIVE mg/dL
Leukocytes,Ua: NEGATIVE
Nitrite: NEGATIVE
Protein, ur: NEGATIVE mg/dL
Specific Gravity, Urine: 1.023 (ref 1.005–1.030)
pH: 5 (ref 5.0–8.0)

## 2022-12-14 LAB — POCT PREGNANCY, URINE: Preg Test, Ur: POSITIVE — AB

## 2022-12-14 NOTE — Progress Notes (Signed)
Hansel Feinstein CNM talked with pt in Triage regarding test results and d/c plan. CNM discussed d/c plan and d/c pt home from Triage. Pt voiced understanding

## 2022-12-14 NOTE — MAU Provider Note (Signed)
Chief Complaint: Vaginal Bleeding  Seen by provider at 0600      SUBJECTIVE HPI: Jessica Kaiser is a 37 y.o. E3P2951 at [redacted]w[redacted]d by LMP who presents to maternity admissions reporting vaginal bleeding.  Was seen in office yesterday and told she had a subchorionic hemorrhage.  Woke up at 0330 with heavier bleeding than she had before. So came in as directed.  Only mild cramping. . She denies urinary symptoms, h/a, dizziness, n/v, or fever/chills.     Vaginal Bleeding The patient's primary symptoms include pelvic pain (mild) and vaginal bleeding. The patient's pertinent negatives include no genital odor. This is a recurrent problem. The current episode started today. The pain is mild. She is pregnant.   RN Note: Jessica Kaiser is a 37 y.o. at Unknown here in MAU reporting  vag bleeding. Was seen at office yesterday and had u/s and told had subchorionic hemorrhage. Was told if bleeding got heavier to be seen. Awoke at 0330 with heavier bleeding like a period and cramping. Continues to have mild cramping.  LMP: 10/20/22 Onset of complaint: 0330 Pain score: 2  Past Medical History:  Diagnosis Date   Medical history non-contributory    Past Surgical History:  Procedure Laterality Date   FINGER SURGERY     Social History   Socioeconomic History   Marital status: Married    Spouse name: Not on file   Number of children: Not on file   Years of education: Not on file   Highest education level: Not on file  Occupational History   Not on file  Tobacco Use   Smoking status: Never   Smokeless tobacco: Never  Substance and Sexual Activity   Alcohol use: No   Drug use: No   Sexual activity: Yes    Birth control/protection: None  Other Topics Concern   Not on file  Social History Narrative   Not on file   Social Determinants of Health   Financial Resource Strain: Not on file  Food Insecurity: Not on file  Transportation Needs: Not on file  Physical Activity: Not on file  Stress: Not  on file  Social Connections: Not on file  Intimate Partner Violence: Not on file   No current facility-administered medications on file prior to encounter.   Current Outpatient Medications on File Prior to Encounter  Medication Sig Dispense Refill   Prenatal Vit-Fe Fumarate-FA (PRENATAL MULTIVITAMIN) TABS tablet Take 1 tablet by mouth daily at 12 noon.     promethazine (PHENERGAN) 25 MG tablet Take 0.5-1 tablets (12.5-25 mg total) by mouth every 6 (six) hours as needed. 30 tablet 0   No Known Allergies  I have reviewed patient's Past Medical Hx, Surgical Hx, Family Hx, Social Hx, medications and allergies.   ROS:  Review of Systems  Genitourinary:  Positive for pelvic pain (mild) and vaginal bleeding.   Review of Systems  Other systems negative   Physical Exam  Physical Exam Patient Vitals for the past 24 hrs:  BP Temp Pulse Resp SpO2 Height Weight  12/14/22 0511 114/67 -- -- -- -- -- --  12/14/22 0507 -- 98.5 F (36.9 C) 79 17 100 % 5\' 6"  (1.676 m) 80.3 kg   Constitutional: Well-developed, well-nourished female in no acute distress.  Cardiovascular: normal rate Respiratory: normal effort GI: Abd soft, non-tender. Pos BS x 4 MS: Extremities nontender, no edema, normal ROM Neurologic: Alert and oriented x 4.  GU: Neg CVAT.  PELVIC EXAM: pelvic exam deferred in lieu of transvaginal ultrasound  LAB RESULTS Results for orders placed or performed during the hospital encounter of 12/14/22 (from the past 24 hour(s))  Pregnancy, urine POC     Status: Abnormal   Collection Time: 12/14/22  6:09 AM  Result Value Ref Range   Preg Test, Ur POSITIVE (A) NEGATIVE  Urinalysis, Routine w reflex microscopic     Status: Abnormal   Collection Time: 12/14/22  6:11 AM  Result Value Ref Range   Color, Urine YELLOW YELLOW   APPearance CLEAR CLEAR   Specific Gravity, Urine 1.023 1.005 - 1.030   pH 5.0 5.0 - 8.0   Glucose, UA NEGATIVE NEGATIVE mg/dL   Hgb urine dipstick MODERATE (A)  NEGATIVE   Bilirubin Urine NEGATIVE NEGATIVE   Ketones, ur NEGATIVE NEGATIVE mg/dL   Protein, ur NEGATIVE NEGATIVE mg/dL   Nitrite NEGATIVE NEGATIVE   Leukocytes,Ua NEGATIVE NEGATIVE   RBC / HPF 6-10 0 - 5 RBC/hpf   WBC, UA 0-5 0 - 5 WBC/hpf   Bacteria, UA RARE (A) NONE SEEN   Squamous Epithelial / HPF 0-5 0 - 5 /HPF   Mucus PRESENT     IMAGING US OB Transvaginal  Result Date: 12/14/2022 CLINICAL DATA:  Vaginal bleeding. Known subchorionic hemorrhage by in office ultrasound. EXAM: TRANSVAGINAL OB ULTRASOUND TECHNIQUE: Transvaginal ultrasound was performed for complete evaluation of the gestation as well as the maternal uterus, adnexal regions, and pelvic cul-de-sac. COMPARISON:  None Available. FINDINGS: Intrauterine gestational sac: Single Yolk sac:  Visualized Embryo:  Visualized Cardiac Activity: Visualized Heart Rate: 121 bpm CRL:   12.7 mm   7 w 4 d                  [place] EDC: 07/29/2023 Subchorionic hemorrhage:  None visualized. Maternal uterus/adnexae: Right ovary measures 2.8 x 1.9 x 2.2 cm. Left ovary measures 2.3 x 1.3 x 1.9 cm. No adnexal mass. No substantial free fluid. IMPRESSION: Single living intrauterine gestation at estimated 7 week 4 day gestational age by crown-rump length. No subchorionic hemorrhage visualized on today's study. Electronically Signed   By: Misty Stanley M.D.   On: 12/14/2022 06:01    MAU Management/MDM: I have reviewed the triage vital signs and the nursing notes.   Pertinent labs & imaging results that were available during my care of the patient were reviewed by me and considered in my medical decision making (see chart for details).      I have reviewed her medical records including past results, notes and treatments. Medical, Surgical, and family history were reviewed.  Medications and recent lab tests were reviewed  Ordered Ultrasound to rule out miscarriage. This showed a viable live fetus with no evidence of subchorionic hemorrhage.   This  bleeding/pain can represent a normal pregnancy with bleeding, spontaneous abortion or even an ectopic which can be life-threatening.  The process as listed above helps to determine which of these is present.  Reviewed findings of [place] Reviewed pelvic rest and supportive care Warning signs reviewed  ASSESSMENT [place] IUP at [redacted]w[redacted]d Bleeding in first trimester Cramping in first trimester  PLAN Discharge home Bleeding precautions Followup in office as scheduled  Pt stable at time of discharge. Encouraged to return here if she develops worsening of symptoms, increase in pain, fever, or other concerning symptoms.    Hansel Feinstein CNM, MSN Certified Nurse-Midwife 12/14/2022  6:11 AM

## 2022-12-14 NOTE — MAU Note (Signed)
.  Jessica Kaiser is a 37 y.o. at Unknown here in MAU reporting  vag bleeding. Was seen at office yesterday and had u/s and told had subchorionic hemorrhage. Was told if bleeding got heavier to be seen. Awoke at 0330 with heavier bleeding like a period and cramping. Continues to have mild cramping.  LMP: 10/20/22 Onset of complaint: 0330 Pain score: 2 Vitals:   12/14/22 0507 12/14/22 0511  BP:  114/67  Pulse: 79   Resp: 17   Temp: 98.5 F (36.9 C)   SpO2: 100%      FHT:na Lab orders placed from triage:u/a
